# Patient Record
Sex: Male | Born: 1975 | Race: White | Hispanic: No | Marital: Married
Health system: Southern US, Community
[De-identification: ages and names within clinical notes are randomized; demographics above are authoritative.]

---

## 2010-07-23 ENCOUNTER — Emergency Department (HOSPITAL_BASED_OUTPATIENT_CLINIC_OR_DEPARTMENT_OTHER)
Admission: EM | Admit: 2010-07-23 | Discharge: 2010-07-24 | Payer: Self-pay | Source: Home / Self Care | Admitting: Emergency Medicine

## 2010-07-24 LAB — URINALYSIS, ROUTINE W REFLEX MICROSCOPIC
Ketones, ur: NEGATIVE mg/dL
Nitrite: NEGATIVE
Protein, ur: NEGATIVE mg/dL
pH: 5 (ref 5.0–8.0)

## 2010-07-24 LAB — CBC
HCT: 46.8 % (ref 39.0–52.0)
Hemoglobin: 17.3 g/dL — ABNORMAL HIGH (ref 13.0–17.0)
MCH: 30.2 pg (ref 26.0–34.0)
MCV: 81.7 fL (ref 78.0–100.0)
RBC: 5.73 MIL/uL (ref 4.22–5.81)

## 2010-07-24 LAB — DIFFERENTIAL
Lymphocytes Relative: 20 % (ref 12–46)
Lymphs Abs: 1.9 10*3/uL (ref 0.7–4.0)
Monocytes Relative: 9 % (ref 3–12)
Neutro Abs: 6.6 10*3/uL (ref 1.7–7.7)
Neutrophils Relative %: 67 % (ref 43–77)

## 2010-07-24 LAB — COMPREHENSIVE METABOLIC PANEL
AST: 25 U/L (ref 0–37)
Albumin: 4.3 g/dL (ref 3.5–5.2)
BUN: 10 mg/dL (ref 6–23)
Creatinine, Ser: 0.9 mg/dL (ref 0.4–1.5)
GFR calc Af Amer: >60 mL/min (ref >60)
Total Protein: 7.7 g/dL (ref 6.0–8.3)

## 2010-07-24 LAB — D-DIMER, QUANTITATIVE: D-Dimer, Quant: 2.33 ug/mL-FEU — ABNORMAL HIGH (ref 0.00–0.48)

## 2019-08-20 ENCOUNTER — Ambulatory Visit: Payer: Self-pay | Attending: Internal Medicine

## 2019-08-20 DIAGNOSIS — Z23 Encounter for immunization: Secondary | ICD-10-CM | POA: Insufficient documentation

## 2019-08-20 NOTE — Progress Notes (Signed)
   Covid-[facility] Vaccination [facility]  Name:  Casey Martinez    MRN: [id number] DOB: 1976/06/19  08/20/2019  [name] was observed post Covid-19 immunization for 15 minutes without incidence. He was provided with Vaccine Information Sheet and instruction to access the V-Safe system.   [name] was instructed to call 911 with any severe reactions post vaccine: Marland Kitchen Difficulty breathing  . Swelling of your face and throat  . A fast heartbeat  . A bad rash all over your body  . Dizziness and weakness    Immunizations Administered    Name Date Dose VIS Date Route   Pfizer COVID-19 Vaccine 08/20/2019  1:08 PM 0.3 mL 06/11/2019 Intramuscular   Manufacturer: [employer], [employer]   Lot: [id number]   NDC: [address]-0

## 2019-09-14 ENCOUNTER — Ambulatory Visit: Payer: Self-pay | Attending: Internal Medicine

## 2019-09-14 DIAGNOSIS — Z23 Encounter for immunization: Secondary | ICD-10-CM

## 2019-09-14 NOTE — Progress Notes (Signed)
   Covid-19 Vaccination Clinic  Name:  Casey Martinez    MRN: [id number] DOB: 05-24-1976  09/14/2019  [name] was observed post Covid-19 immunization for 15 minutes without incident. He was provided with Vaccine Information Sheet and instruction to access the V-Safe system.   [name] was instructed to call 911 with any severe reactions post vaccine: Marland Kitchen Difficulty breathing  . Swelling of face and throat  . A fast heartbeat  . A bad rash all over body  . Dizziness and weakness   Immunizations Administered    Name Date Dose VIS Date Route   Pfizer COVID-19 Vaccine 09/14/2019  8:42 AM 0.3 mL 06/11/2019 Intramuscular   Manufacturer: [employer], [employer]   Lot: [id number]   NDC: [address]-1
